# Patient Record
Sex: Male | Born: 1965 | Race: White | Hispanic: No | Marital: Married | Smoking: Never smoker
Health system: Southern US, Community
[De-identification: ages and names within clinical notes are randomized; demographics above are authoritative.]

## PROBLEM LIST (undated history)

## (undated) DIAGNOSIS — F419 Anxiety disorder, unspecified: Secondary | ICD-10-CM

## (undated) DIAGNOSIS — T7840XA Allergy, unspecified, initial encounter: Secondary | ICD-10-CM

## (undated) DIAGNOSIS — T783XXA Angioneurotic edema, initial encounter: Secondary | ICD-10-CM

## (undated) HISTORY — DX: Anxiety disorder, unspecified: F41.9

## (undated) HISTORY — DX: Allergy, unspecified, initial encounter: T78.40XA

## (undated) HISTORY — DX: Angioneurotic edema, initial encounter: T78.3XXA

---

## 2004-11-21 ENCOUNTER — Encounter: Admission: RE | Admit: 2004-11-21 | Discharge: 2004-11-21 | Payer: Self-pay | Admitting: Internal Medicine

## 2005-04-10 ENCOUNTER — Ambulatory Visit: Payer: Self-pay | Admitting: Internal Medicine

## 2006-04-19 ENCOUNTER — Ambulatory Visit: Payer: Self-pay | Admitting: Internal Medicine

## 2006-04-26 ENCOUNTER — Ambulatory Visit: Payer: Self-pay | Admitting: Internal Medicine

## 2006-05-20 IMAGING — CR DG HAND COMPLETE 3+V*L*
3 series · 3 of 3 positions shown · non-contrast
Comparison: none

CLINICAL DATA: 39 year old who injured left hand with pain and swelling.
 LEFT HAND, THREE VIEWS:
 There are oblique spiral type fractures involving the fifth and third metacarpal shafts.  No articular surface involvement.  I do not see a definite fracture of the fourth metacarpal.  The joint spaces are maintained.

[view not recorded (1 of 3)]
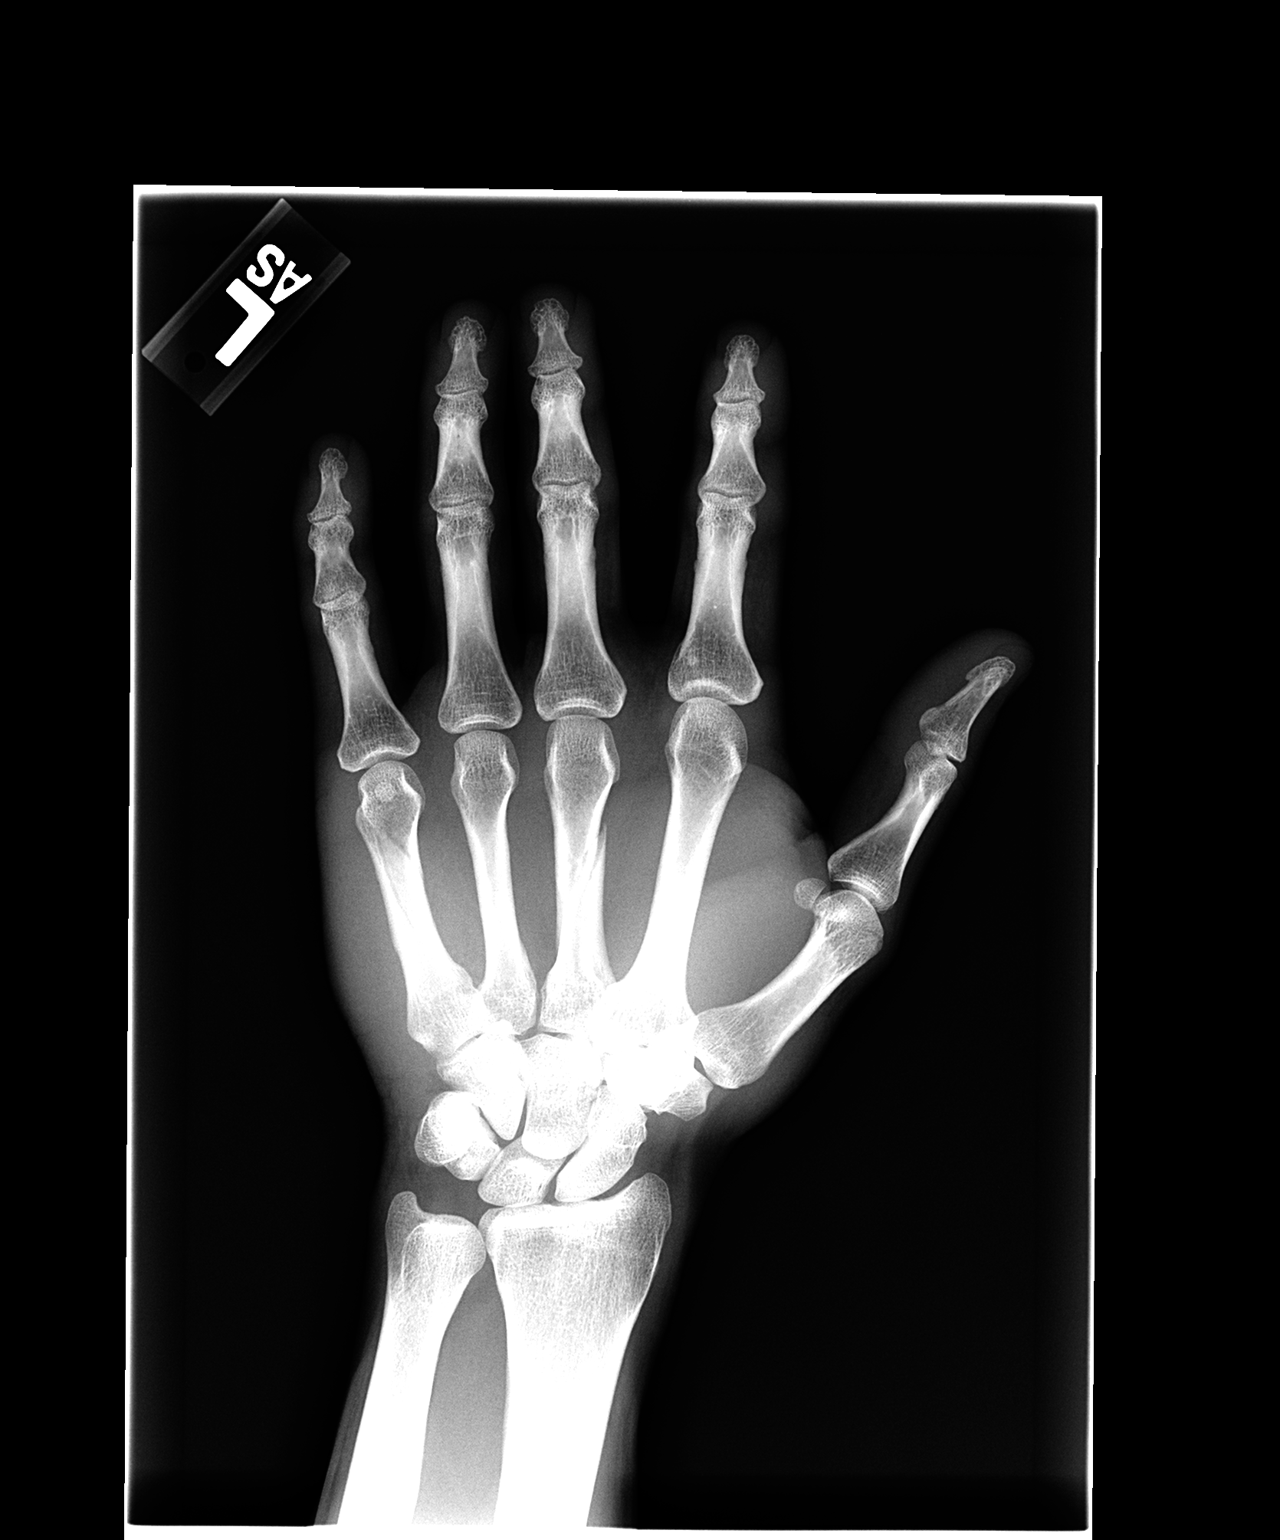

[view not recorded (2 of 3)]
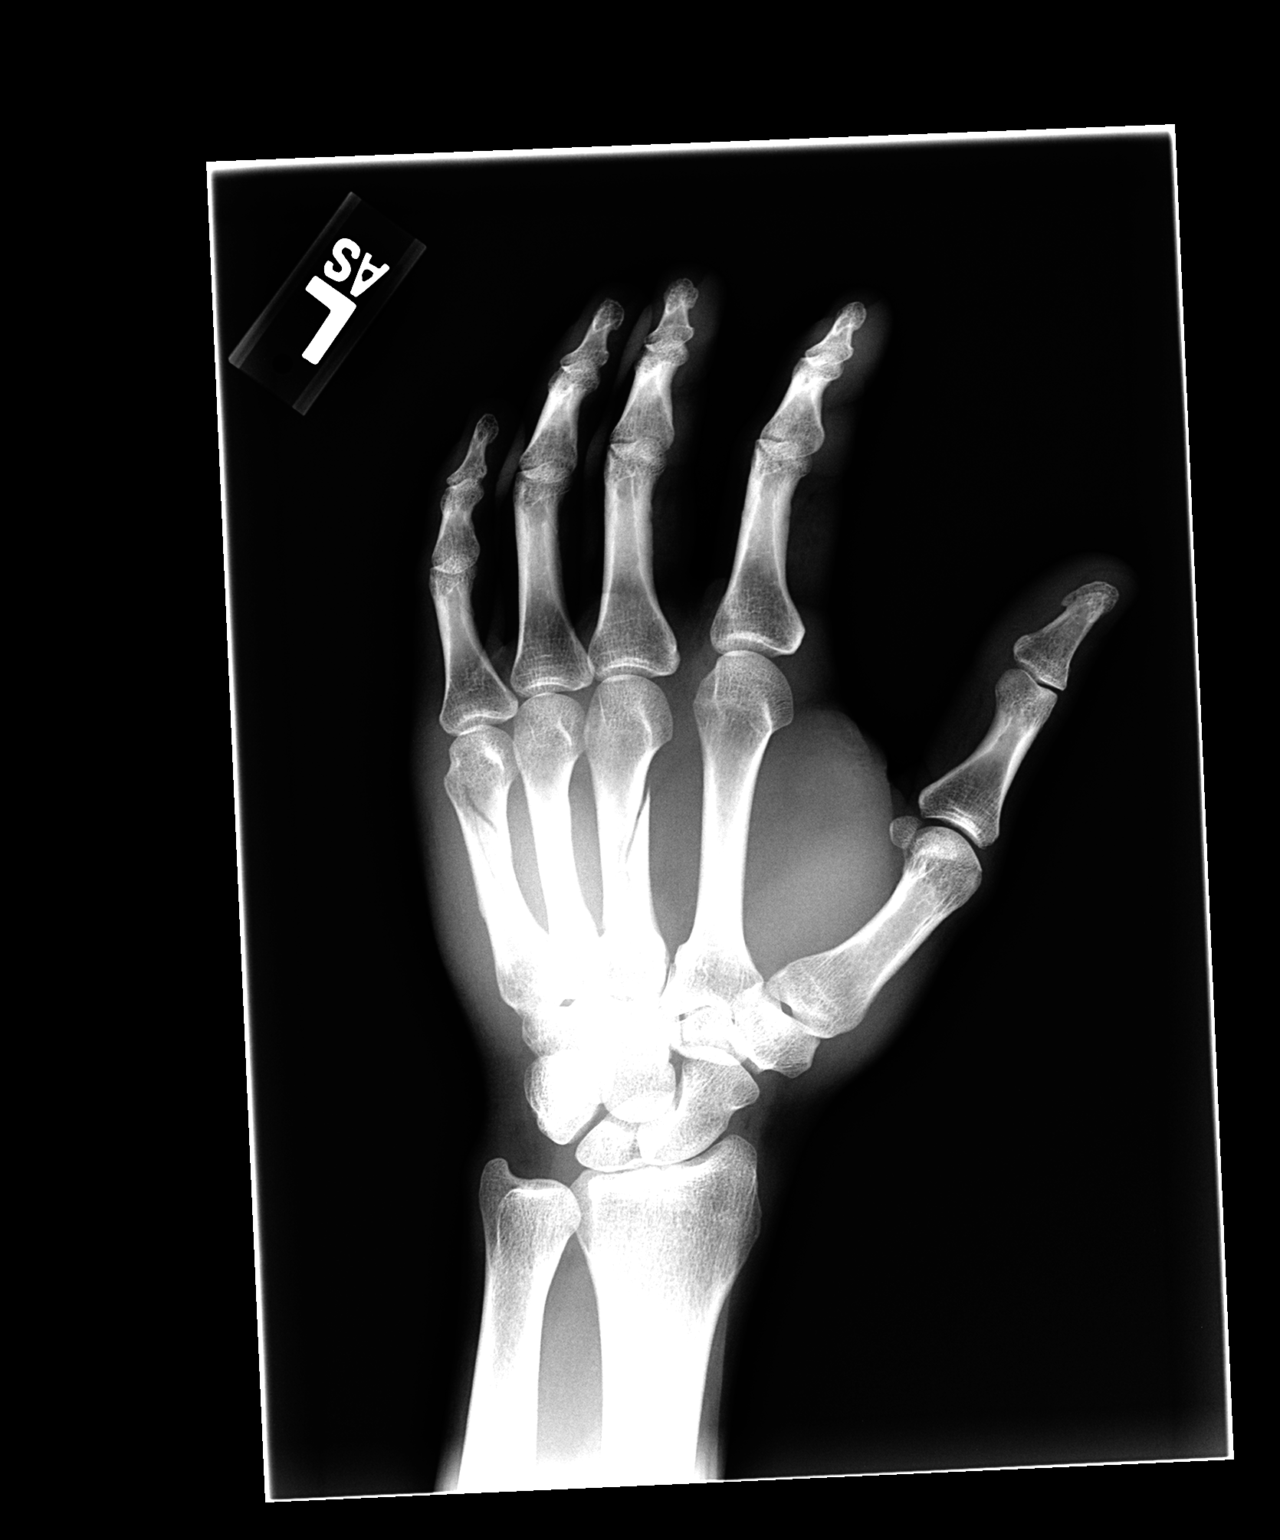

[view not recorded (3 of 3)]
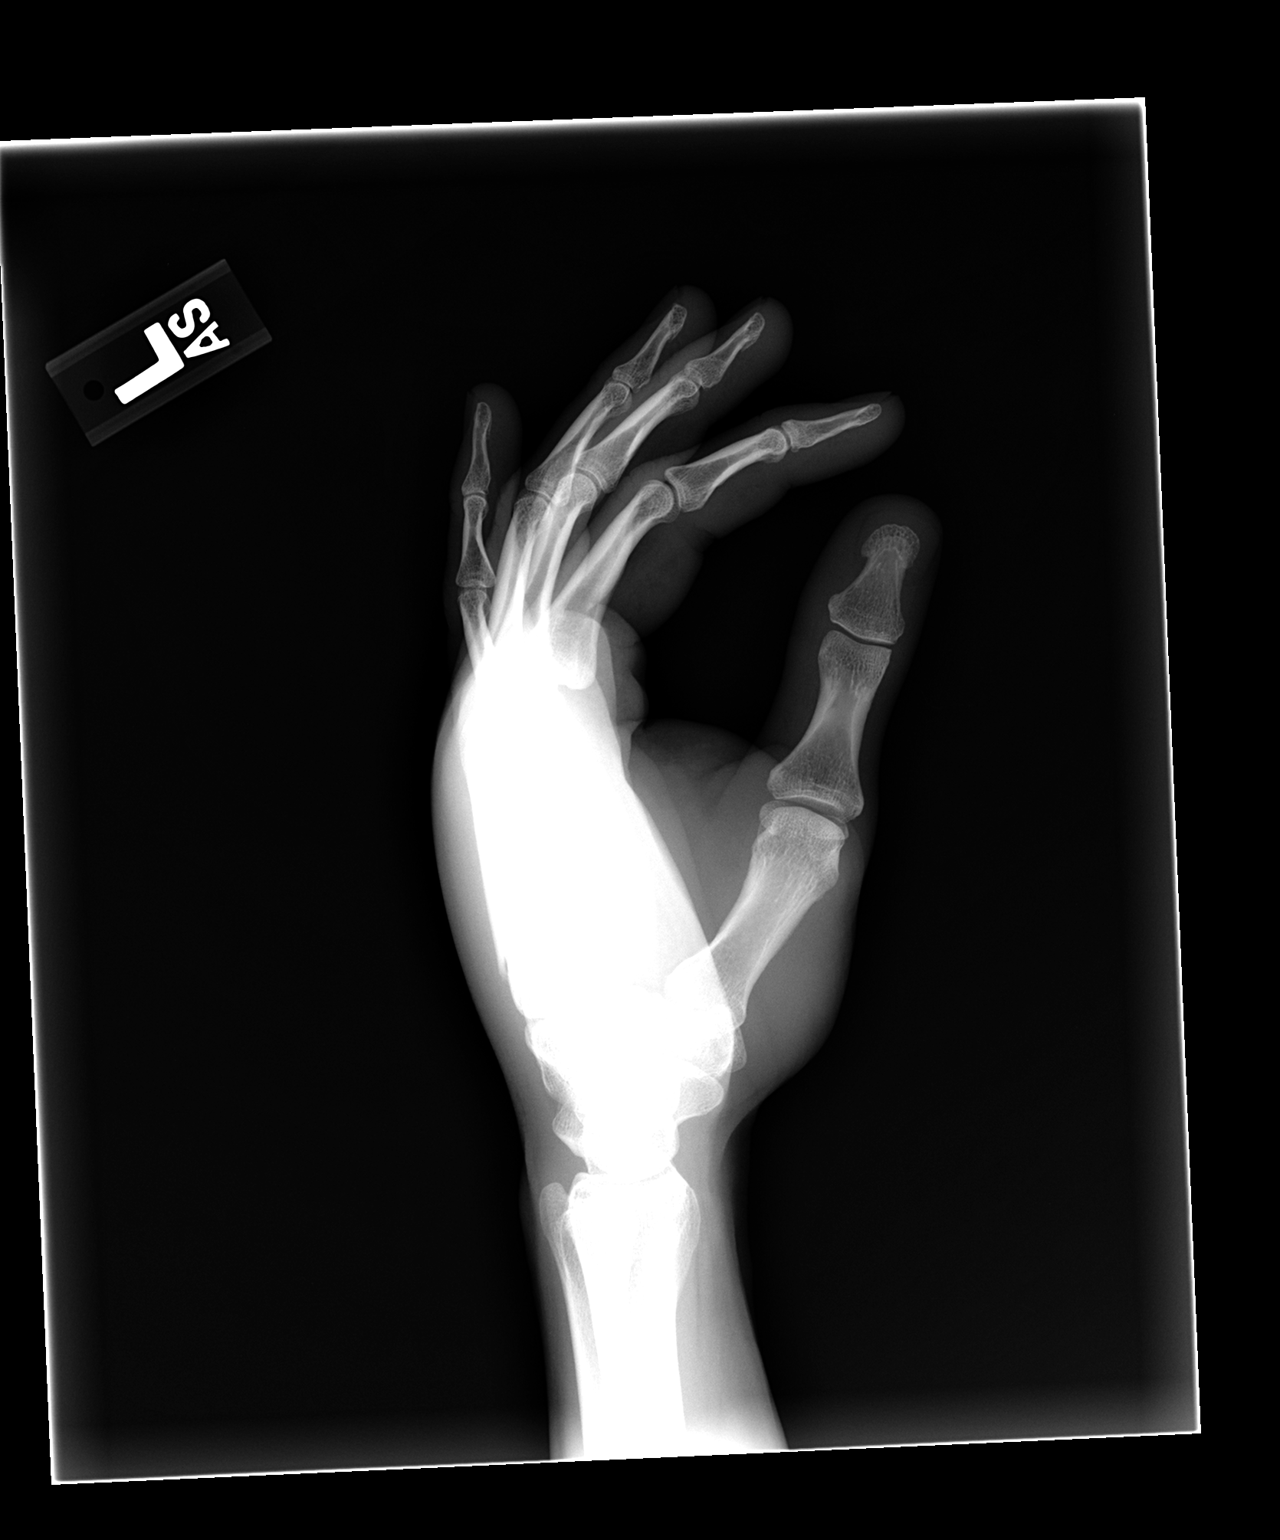

[3 of 3 positions shown; findings below may reference images not displayed]

IMPRESSION: 1.  Third and fifth metacarpal shaft fractures.

## 2006-10-25 ENCOUNTER — Encounter: Payer: Self-pay | Admitting: Internal Medicine

## 2007-03-06 ENCOUNTER — Ambulatory Visit: Payer: Self-pay | Admitting: Internal Medicine

## 2007-03-06 DIAGNOSIS — F4323 Adjustment disorder with mixed anxiety and depressed mood: Secondary | ICD-10-CM

## 2007-04-26 ENCOUNTER — Encounter: Payer: Self-pay | Admitting: Internal Medicine

## 2007-05-07 ENCOUNTER — Telehealth: Payer: Self-pay | Admitting: Internal Medicine

## 2007-10-08 ENCOUNTER — Ambulatory Visit: Payer: Self-pay | Admitting: Internal Medicine

## 2007-10-08 LAB — CONVERTED CEMR LAB
ALT: 20 units/L (ref 0–53)
Alkaline Phosphatase: 53 units/L (ref 39–117)
BUN: 19 mg/dL (ref 6–23)
Basophils Absolute: 0 10*3/uL (ref 0.0–0.1)
Basophils Relative: 0 % (ref 0.0–1.0)
Bilirubin, Direct: 0.1 mg/dL (ref 0.0–0.3)
CO2: 27 meq/L (ref 19–32)
Chloride: 104 meq/L (ref 96–112)
Creatinine, Ser: 1.2 mg/dL (ref 0.4–1.5)
Eosinophils Relative: 3.9 % (ref 0.0–5.0)
GFR calc Af Amer: 85 mL/min
GFR calc non Af Amer: 70 mL/min
Glucose, Bld: 105 mg/dL — ABNORMAL HIGH (ref 70–99)
HDL: 45.1 mg/dL (ref >39.0)
LDL Cholesterol: 115 mg/dL — ABNORMAL HIGH (ref 0–99)
Lymphocytes Relative: 26.7 % (ref 12.0–46.0)
Monocytes Absolute: 0.4 10*3/uL (ref 0.1–1.0)
Monocytes Relative: 6.7 % (ref 3.0–12.0)
Neutro Abs: 3.4 10*3/uL (ref 1.4–7.7)
Platelets: 201 10*3/uL (ref 150–400)
RBC: 4.85 M/uL (ref 4.22–5.81)
Sodium: 137 meq/L (ref 135–145)
TSH: 1.45 microintl units/mL (ref 0.35–5.50)
Total CHOL/HDL Ratio: 4.1

## 2007-10-16 ENCOUNTER — Ambulatory Visit: Payer: Self-pay | Admitting: Internal Medicine

## 2007-10-16 DIAGNOSIS — F411 Generalized anxiety disorder: Secondary | ICD-10-CM | POA: Insufficient documentation

## 2008-06-25 ENCOUNTER — Encounter: Payer: Self-pay | Admitting: Internal Medicine

## 2009-07-05 ENCOUNTER — Encounter: Payer: Self-pay | Admitting: Internal Medicine

## 2010-02-15 ENCOUNTER — Ambulatory Visit: Payer: Self-pay | Admitting: Internal Medicine

## 2010-02-15 DIAGNOSIS — H6690 Otitis media, unspecified, unspecified ear: Secondary | ICD-10-CM | POA: Insufficient documentation
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
## 2010-05-03 NOTE — Letter (Signed)
Summary: [facility]  [facility]   Imported By: Maryln Gottron 08/02/2009 13:12:28  _____________________________________________________________________  External Attachment:    Type:   Image     Comment:   External Document
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
## 2010-09-16 ENCOUNTER — Other Ambulatory Visit (INDEPENDENT_AMBULATORY_CARE_PROVIDER_SITE_OTHER): Payer: BC Managed Care – PPO

## 2010-09-16 DIAGNOSIS — Z0000 Encounter for general adult medical examination without abnormal findings: Secondary | ICD-10-CM

## 2010-09-16 LAB — CBC WITH DIFFERENTIAL/PLATELET
Basophils Relative: 0.3 % (ref 0.0–3.0)
Eosinophils Absolute: 0.3 10*3/uL (ref 0.0–0.7)
Hemoglobin: 15.3 g/dL (ref 13.0–17.0)
MCHC: 33.9 g/dL (ref 30.0–36.0)
Neutro Abs: 4.1 10*3/uL (ref 1.4–7.7)
RBC: 4.99 Mil/uL (ref 4.22–5.81)

## 2010-09-16 LAB — HEPATIC FUNCTION PANEL
ALT: 23 U/L (ref 0–53)
AST: 22 U/L (ref 0–37)
Albumin: 4.7 g/dL (ref 3.5–5.2)
Alkaline Phosphatase: 55 U/L (ref 39–117)

## 2010-09-16 LAB — BASIC METABOLIC PANEL: Chloride: 106 mEq/L (ref 96–112)

## 2010-09-16 LAB — POCT URINALYSIS DIPSTICK
Blood, UA: NEGATIVE
Glucose, UA: NEGATIVE
Ketones, UA: NEGATIVE
Spec Grav, UA: 1.02

## 2010-09-16 LAB — LDL CHOLESTEROL, DIRECT: Direct LDL: 135.1 mg/dL

## 2010-09-16 LAB — LIPID PANEL
Cholesterol: 202 mg/dL — ABNORMAL HIGH (ref 0–200)
HDL: 43.1 mg/dL (ref >39.00)

## 2010-09-16 LAB — TSH: TSH: 1.92 u[IU]/mL (ref 0.35–5.50)

## 2010-09-26 ENCOUNTER — Encounter: Payer: Self-pay | Admitting: Internal Medicine

## 2010-09-27 ENCOUNTER — Ambulatory Visit (INDEPENDENT_AMBULATORY_CARE_PROVIDER_SITE_OTHER): Payer: BC Managed Care – PPO | Admitting: Internal Medicine

## 2010-09-27 ENCOUNTER — Encounter: Payer: Self-pay | Admitting: Internal Medicine

## 2010-09-27 VITALS — BP 120/80 | HR 80 | Temp 98.4°F | Resp 16 | Ht 69.5 in | Wt 176.0 lb

## 2010-09-27 DIAGNOSIS — F329 Major depressive disorder, single episode, unspecified: Secondary | ICD-10-CM

## 2010-09-27 DIAGNOSIS — Z0000 Encounter for general adult medical examination without abnormal findings: Secondary | ICD-10-CM

## 2010-09-27 MED ORDER — SERTRALINE HCL 50 MG PO TABS: 50.0000 mg | ORAL_TABLET | Freq: Every day | ORAL | Status: DC

## 2010-09-27 NOTE — Patient Instructions (Signed)
It is important that you exercise regularly, at least 20 minutes 3 to 4 times per week.  If you develop chest pain or shortness of breath seek  medical attention.  Return office visit 2 months

## 2010-09-27 NOTE — Progress Notes (Signed)
Subjective:    Patient ID: Jason Edwards, male    DOB: [birth year], 45 y.o.   MRN: [id number]  HPI  45 year old patient who is seen today for a annual health examination. He has done quite well except for worsening clinical depression. He has had the depressed mood in the past. Options were discussed and he feels that he would benefit from retrieved with  sertraline.  Wt Readings from Last 3 Encounters:  09/27/10 176 lb (79.833 kg)  02/15/10 181 lb (82.101 kg)  10/16/07 182 lb (82.555 kg)    Past Medical History  Diagnosis Date  . Allergy   . Concussion 03/2005    closed head injury  . Angioedema   . Anxiety    No past surgical history on file.  reports that he has never smoked. He does not have any smokeless tobacco history on file. He reports that he drinks alcohol. He reports that he does not use illicit drugs. family history includes Alcohol abuse in an unspecified family member; Coronary artery disease in an unspecified family member; Heart attack in his father and paternal grandfather; Hyperlipidemia in his father and unspecified family member; and Obesity in his father. No Known Allergies  Review of Systems  Constitutional: Negative for fever, chills, activity change, appetite change and fatigue.  HENT: Negative for hearing loss, ear pain, congestion, rhinorrhea, sneezing, mouth sores, trouble swallowing, neck pain, neck stiffness, dental problem, voice change, sinus pressure and tinnitus.   Eyes: Negative for photophobia, pain, redness and visual disturbance.  Respiratory: Negative for apnea, cough, choking, chest tightness, shortness of breath and wheezing.   Cardiovascular: Negative for chest pain, palpitations and leg swelling.  Gastrointestinal: Negative for nausea, vomiting, abdominal pain, diarrhea, constipation, blood in stool, abdominal distention, anal bleeding and rectal pain.  Genitourinary: Negative for dysuria, urgency, frequency, hematuria, flank pain, decreased  urine volume, discharge, penile swelling, scrotal swelling, difficulty urinating, genital sores and testicular pain.  Musculoskeletal: Negative for myalgias, back pain, joint swelling, arthralgias and gait problem.  Skin: Negative for color change, rash and wound.  Neurological: Negative for dizziness, tremors, seizures, syncope, facial asymmetry, speech difficulty, weakness, light-headedness, numbness and headaches.  Hematological: Negative for adenopathy. Does not bruise/bleed easily.  Psychiatric/Behavioral: Positive for dysphoric mood. Negative for suicidal ideas, hallucinations, behavioral problems, confusion, sleep disturbance, self-injury, decreased concentration and agitation. The patient is not nervous/anxious.        Objective:   Physical Exam  Constitutional: He appears well-developed and well-nourished.  HENT:  Head: Normocephalic and atraumatic.  Right Ear: External ear normal.  Left Ear: External ear normal.  Nose: Nose normal.  Mouth/Throat: Oropharynx is clear and moist.  Eyes: Conjunctivae and EOM are normal. Pupils are equal, round, and reactive to light. No scleral icterus.  Neck: Normal range of motion. Neck supple. No JVD present. No thyromegaly present.  Cardiovascular: Regular rhythm, normal heart sounds and intact distal pulses.  Exam reveals no gallop and no friction rub.   No murmur heard. Pulmonary/Chest: Effort normal and breath sounds normal. He exhibits no tenderness.  Abdominal: Soft. Bowel sounds are normal. He exhibits no distension and no mass. There is no tenderness.  Genitourinary: Prostate normal and penis normal.  Musculoskeletal: Normal range of motion. He exhibits no edema and no tenderness.  Lymphadenopathy:    He has no cervical adenopathy.  Neurological: He is alert. He has normal reflexes. No cranial nerve deficit. Coordination normal.  Skin: Skin is warm and dry. No rash noted.  Psychiatric: He has  a normal mood and affect. His behavior is  normal.          Assessment & Plan:     Annual health assessment Clinical depression. We'll resume sertraline and recheck in 6 weeks

## 2010-11-29 ENCOUNTER — Ambulatory Visit: Payer: BC Managed Care – PPO | Admitting: Internal Medicine

## 2010-12-14 ENCOUNTER — Telehealth: Payer: Self-pay | Admitting: Internal Medicine

## 2010-12-14 NOTE — Telephone Encounter (Signed)
Attempt to call - VM - LMTCB if questions - was given printed rx at 09/27/10 appt #[phone] 2Rf - should have enough

## 2010-12-14 NOTE — Telephone Encounter (Signed)
Pt is going to be out of town for business for the next 6 month on business and had to cancel is appt tomorrow and is requesting a phone call to go over his medications.

## 2010-12-15 ENCOUNTER — Ambulatory Visit: Payer: BC Managed Care – PPO | Admitting: Internal Medicine

## 2013-04-18 ENCOUNTER — Ambulatory Visit (INDEPENDENT_AMBULATORY_CARE_PROVIDER_SITE_OTHER): Payer: BC Managed Care – PPO | Admitting: Internal Medicine

## 2013-04-18 ENCOUNTER — Encounter: Payer: Self-pay | Admitting: Internal Medicine

## 2013-04-18 VITALS — BP 140/80 | HR 78 | Temp 98.3°F | Resp 20 | Ht 70.0 in | Wt 167.0 lb

## 2013-04-18 DIAGNOSIS — F4323 Adjustment disorder with mixed anxiety and depressed mood: Secondary | ICD-10-CM

## 2013-04-18 DIAGNOSIS — Z23 Encounter for immunization: Secondary | ICD-10-CM

## 2013-04-18 MED ORDER — SERTRALINE HCL 100 MG PO TABS: 100.0000 mg | ORAL_TABLET | Freq: Every day | ORAL | Status: DC

## 2013-04-18 MED ORDER — FLUTICASONE PROPIONATE 50 MCG/ACT NA SUSP
2.0000 | Freq: Every day | NASAL | Status: AC
Start: 2013-04-18 — End: ?

## 2013-04-18 NOTE — Progress Notes (Signed)
Subjective:    Patient ID: Jason Edwards, male    DOB: 1965-06-20, 48 y.o.   MRN: [id number]  HPI 48 year old patient who has a long history of depression. He now lives and works in [place] and [place] His ex-wife and 2 daughters live in the area. He is an increase in situational stress due to the divorce and separation. He has been on sertraline in the past with benefit. He also describes some sinus congestion  Past Medical History  Diagnosis Date  . Allergy   . Concussion 03/2005    closed head injury  . Angioedema   . Anxiety     History   Social History  . Marital Status: Married    Spouse Name: N/A    Number of Children: N/A  . Years of Education: N/A   Occupational History  . Not on file.   Social History Main Topics  . Smoking status: Never Smoker   . Smokeless tobacco: Never Used  . Alcohol Use: 5 - 6 oz/week    10-12 drink(s) per week  . Drug Use: No  . Sexual Activity: Not on file   Other Topics Concern  . Not on file   Social History Narrative  . No narrative on file    History reviewed. No pertinent past surgical history.  Family History  Problem Relation Age of Onset  . Alcohol abuse    . Hyperlipidemia    . Coronary artery disease    . Heart attack Father   . Hyperlipidemia Father   . Obesity Father   . Heart attack Paternal Grandfather     No Known Allergies  Current Outpatient Prescriptions on File Prior to Visit  Medication Sig Dispense Refill  . loratadine (ALAVERT) 10 MG tablet Take 10 mg by mouth daily as needed.         No current facility-administered medications on file prior to visit.    BP 140/80  Pulse 78  Temp(Src) 98.3 F (36.8 C) (Oral)  Resp 20  Ht 5\' 10"  (1.778 m)  Wt 167 lb (75.751 kg)  BMI 23.96 kg/m2  SpO2 98%       Review of Systems  Constitutional: Negative for fever, chills, appetite change and fatigue.  HENT: Positive for sinus pressure. Negative for congestion,  dental problem, ear pain, hearing loss, sore throat, tinnitus, trouble swallowing and voice change.   Eyes: Negative for pain, discharge and visual disturbance.  Respiratory: Negative for cough, chest tightness, wheezing and stridor.   Cardiovascular: Negative for chest pain, palpitations and leg swelling.  Gastrointestinal: Negative for nausea, vomiting, abdominal pain, diarrhea, constipation, blood in stool and abdominal distention.  Genitourinary: Negative for urgency, hematuria, flank pain, discharge, difficulty urinating and genital sores.  Musculoskeletal: Negative for arthralgias, back pain, gait problem, joint swelling, myalgias and neck stiffness.  Skin: Negative for rash.  Neurological: Negative for dizziness, syncope, speech difficulty, weakness, numbness and headaches.  Hematological: Negative for adenopathy. Does not bruise/bleed easily.  Psychiatric/Behavioral: Negative for behavioral problems and dysphoric mood. The patient is not nervous/anxious.        Objective:   Physical Exam  Constitutional: He is oriented to person, place, and time. He appears well-developed.  HENT:  Head: Normocephalic.  Right Ear: External ear normal.  Left Ear: External ear normal.  Eyes: Conjunctivae and EOM are normal.  Neck: Normal range of motion.  Cardiovascular: Normal rate and normal heart sounds.   Pulmonary/Chest: Breath sounds normal.  Abdominal: Bowel sounds are normal.  Musculoskeletal: Normal range of motion. He exhibits no edema and no tenderness.  Neurological: He is alert and oriented to person, place, and time.  Psychiatric: He has a normal mood and affect. His behavior is normal.          Assessment & Plan:   Depression refill sertraline. Return here in one year or as needed

## 2013-04-18 NOTE — Patient Instructions (Signed)
It is important that you exercise regularly, at least 20 minutes 3 to 4 times per week.  If you develop chest pain or shortness of breath seek  medical attention.  Call or return to clinic prn if these symptoms worsen or fail to improve as anticipated.  

## 2013-04-18 NOTE — Progress Notes (Signed)
Pre-visit discussion using our clinic review tool. No additional management support is needed unless otherwise documented below in the visit note.  

## 2014-07-12 ENCOUNTER — Other Ambulatory Visit: Payer: Self-pay | Admitting: Internal Medicine

## 2014-10-24 ENCOUNTER — Other Ambulatory Visit: Payer: Self-pay | Admitting: Internal Medicine
